# Patient Record
Sex: Male | Born: 1990 | Race: Black or African American | Hispanic: No | Marital: Single | Smoking: Never smoker
Health system: Southern US, Community
[De-identification: ages and names within clinical notes are randomized; demographics above are authoritative.]

## PROBLEM LIST (undated history)

## (undated) DIAGNOSIS — L409 Psoriasis, unspecified: Secondary | ICD-10-CM

## (undated) DIAGNOSIS — L309 Dermatitis, unspecified: Secondary | ICD-10-CM

## (undated) HISTORY — DX: Dermatitis, unspecified: L30.9

## (undated) HISTORY — DX: Psoriasis, unspecified: L40.9

---
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
## 2008-05-31 ENCOUNTER — Inpatient Hospital Stay (HOSPITAL_COMMUNITY): Admission: EM | Admit: 2008-05-31 | Discharge: 2008-06-01 | Payer: Self-pay | Admitting: Family Medicine

## 2008-05-31 ENCOUNTER — Encounter (INDEPENDENT_AMBULATORY_CARE_PROVIDER_SITE_OTHER): Payer: Self-pay | Admitting: Internal Medicine

## 2009-07-16 DIAGNOSIS — Y249XXA Unspecified firearm discharge, undetermined intent, initial encounter: Secondary | ICD-10-CM | POA: Insufficient documentation

## 2009-07-16 DIAGNOSIS — W3400XA Accidental discharge from unspecified firearms or gun, initial encounter: Secondary | ICD-10-CM

## 2009-07-16 HISTORY — DX: Accidental discharge from unspecified firearms or gun, initial encounter: W34.00XA

## 2009-11-08 ENCOUNTER — Emergency Department (HOSPITAL_COMMUNITY): Admission: EM | Admit: 2009-11-08 | Discharge: 2009-11-08 | Payer: Self-pay | Admitting: Emergency Medicine

## 2009-12-09 IMAGING — CT CT MAXILLOFACIAL W/ CM
3 of 4 series · 16 of 37 positions shown, 19 images · IV contrast (omniscan)
Comparison: 07/21/2004

CLINICAL DATA: Right-sided facial swelling, clinical concern for
abscess

CT MAXILLOFACIAL WITH CONTRAST
TECHNIQUE: Multidetector CT imaging of the maxillofacial
structures was performed with intravenous contrast. Multiplanar CT
image reconstructions were also generated.
Contrast: 100 ml Omniscan 300 IV contrast

[Series 4: recon 3: supine facial bones · axial · 0.37mm/px · z∈[+4,+195]mm · 12 of 173 slices shown, 15 images]
[im 10/173  brain]
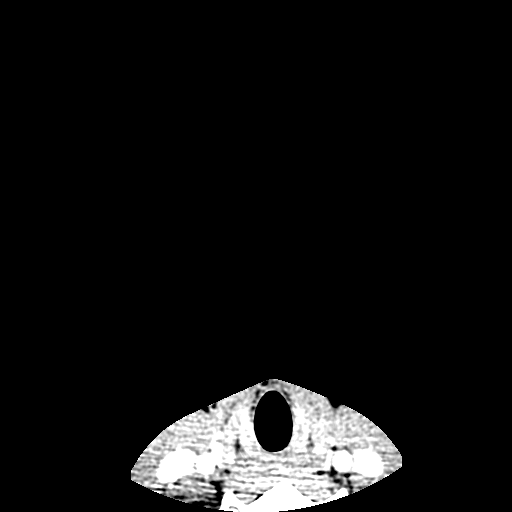
[im 10/173  bone]
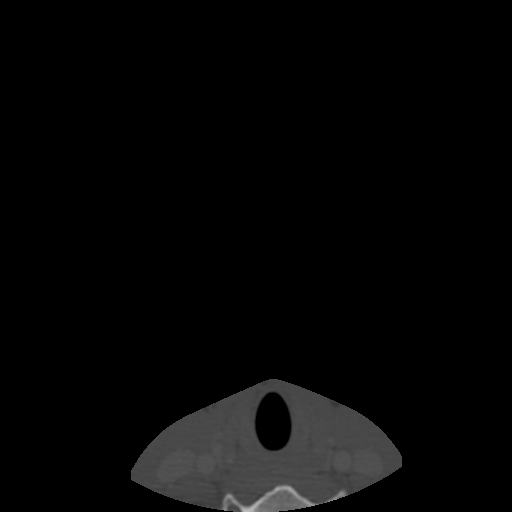
[im 28/173  bone]
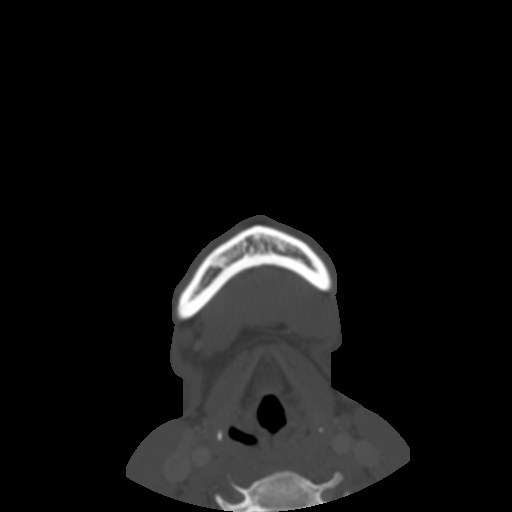
[im 37/173  bone]
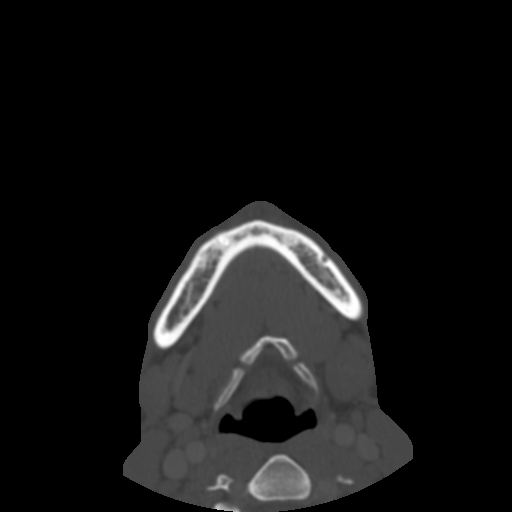
[im 55/173  bone]
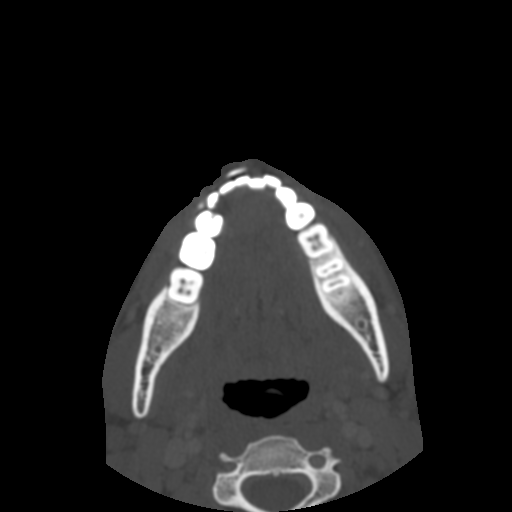
[im 64/173  brain]
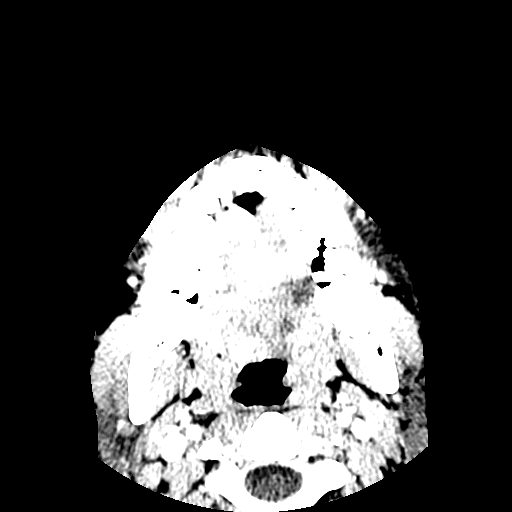
[im 64/173  bone]
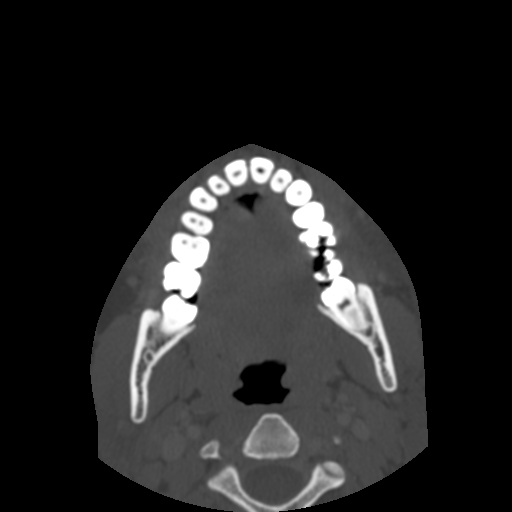
[im 82/173  bone]
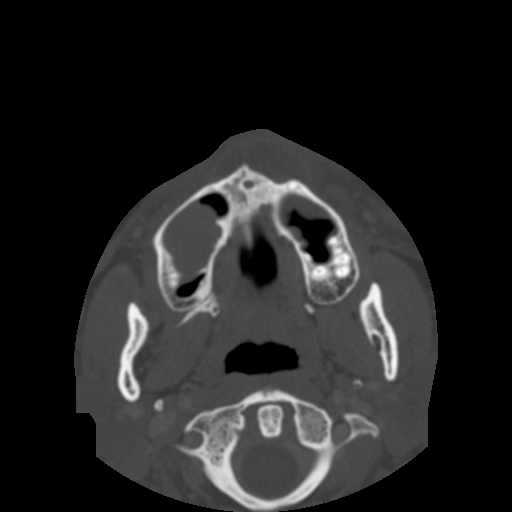
[im 91/173  bone]
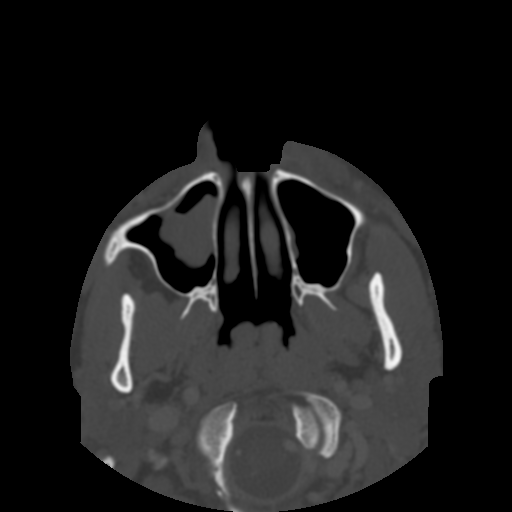
[im 109/173  bone]
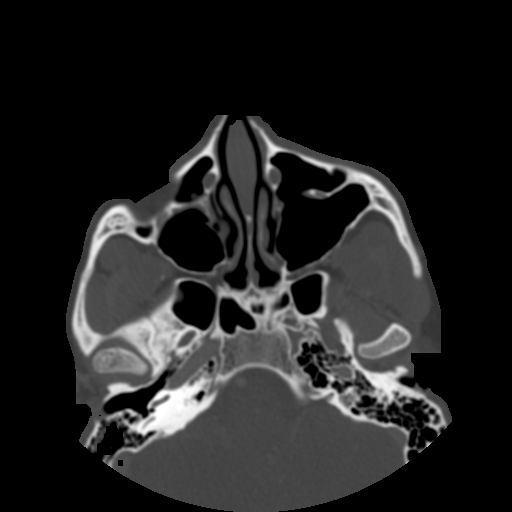
[im 118/173  brain]
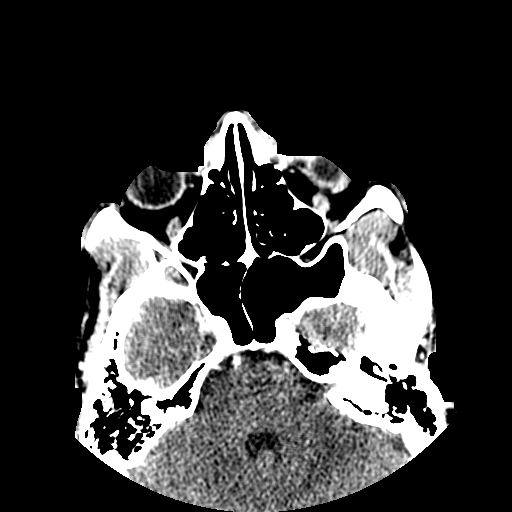
[im 118/173  bone]
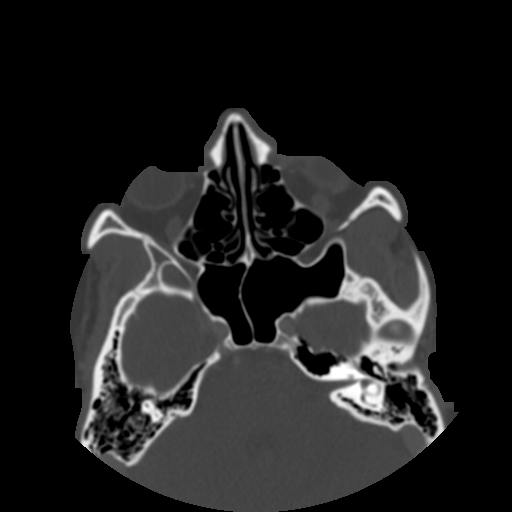
[im 136/173  bone]
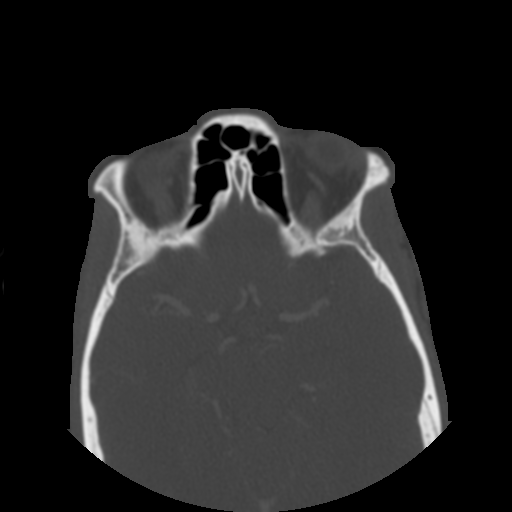
[im 145/173  bone]
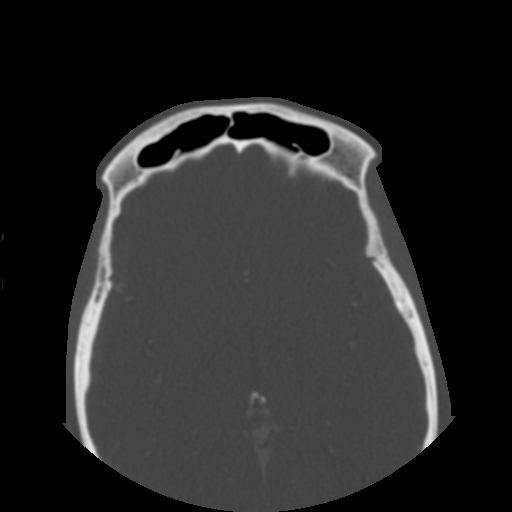
[im 163/173  bone]
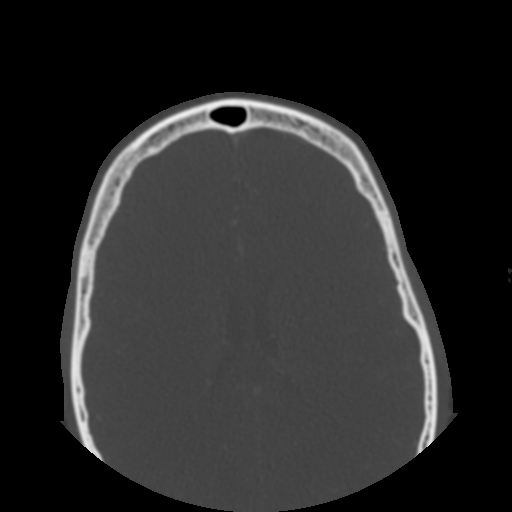

[Series 400: reformatted · coronal · 0.43mm/px · 3 of 85 slices shown (1 of 2)]
[im 14/85  bone]
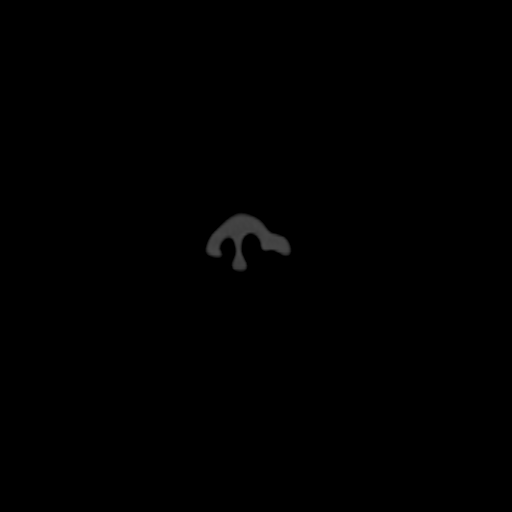
[im 27/85  bone]
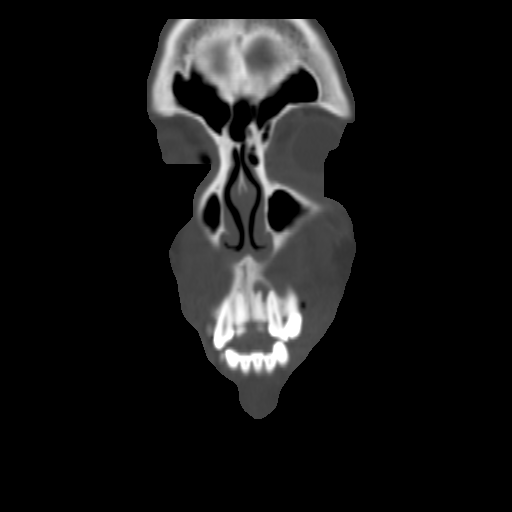
[im 39/85  bone]
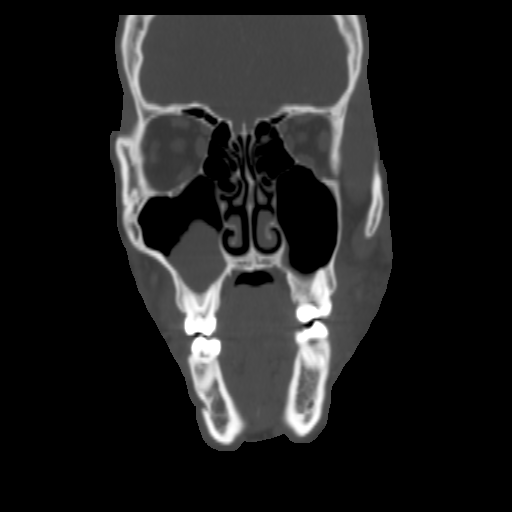

[Series 401: reformatted · sagittal · 0.43mm/px · 1 of 85 slices shown (2 of 2)]
[im 71/85  bone]
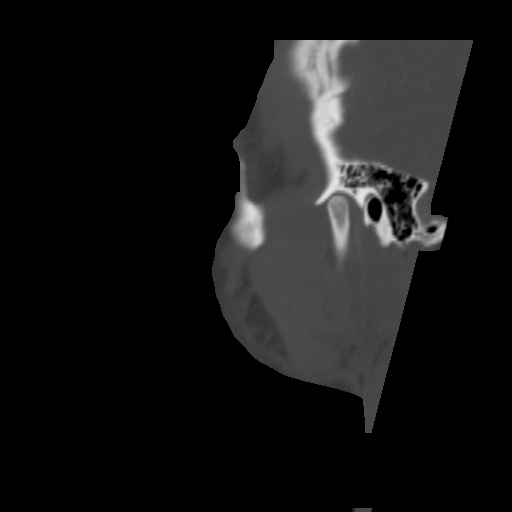

[16 of 37 positions shown; findings below may reference images not displayed]

FINDINGS: Right maxillary and sphenoid sinus mucous retention cysts
versus polyps are again noted.  Minimal left maxillary sinus
mucoperiosteal thickening is new since the prior study.  No
fracture identified.  The orbits are intact. Possible remote left
nasal bone fracture is stable.

There is lucency at the root of the ninth and tenth teeth (left
upper central and lateral incisors, respectively), with peri apical
lucency and peripherally enhancing fluid collection extending
superiorly, measuring overall 3.2 x 1.5 centimeters.  There is soft
tissue swelling along the left nasolabial fold region but sparing
of the left pre and post septal regions.
IMPRESSION: Peri apical abscess emanating from the left upper central and
lateral incisors extending to the left nasolabial fold but sparing
the pre and post septal compartments.  Findings discussed with Dr.
Franz Luis by Dr. Hight at [DATE] p.m. on 05/30/2008.

## 2010-11-28 NOTE — Consult Note (Signed)
NAME:  Patrick Silva, Patrick Silva NO.:  [id number]   MEDICAL RECORD NO.:  [id number]          PATIENT TYPE:  INP   LOCATION:  5121                         FACILITY:  [facility]   PHYSICIAN:  Kinnie Scales. Annalee Genta, M.D.DATE OF BIRTH:  24-Aug-1990   DATE OF CONSULTATION:  05/30/2008  DATE OF DISCHARGE:                                 CONSULTATION   BRIEF HISTORY:  The patient is a 20 year old black male who presents to  [facility] Emergency Department with a history of progressive  left paranasal and upper lip swelling.  The patient had a history of  previous root canal in the left central incisor.  He developed  increasing pain, swelling, and fullness in the alveolar and paranasal  area which had progressed over a 48-hour period.  The patient was unable  to tolerate oral diet because of significant pain and had a low-grade  fever.  He was evaluated by the emergency room staff and found to have  soft tissue swelling consistent with an abscess.  A CT scan was obtained  which showed periapical fluid and a bone erosion in the anterior maxilla  with significant soft tissue swelling overlying the area.  ENT facial  trauma dental consult was obtained for evaluation and management of the  patient's dental alveolar/odontogenic abscess.  Mr. Earna Coder,  administrator on call was notified of the patient's situation and  improved ENT evaluation.   PHYSICAL EXAMINATION:  GENERAL:  The patient is a healthy-appearing 20-  year-old black male, alert in no distress, moderately uncomfortable.  HEENT:  Facial examination shows significant swelling in the paranasal  region.  Intraoral exam shows good dentition with significant swelling  and fullness in the left upper gingivobuccal sulcus.   IMPRESSION:  1. Odontogenic abscess involving left central incisors.  2. Soft tissue/facial abscess.   ASSESSMENT AND PLAN:  Given the patient's examination findings including  CT scan, I recommended to  undertake incision and drainage of a dental  abscess.  This was performed at the bedside in the emergency department  without complication.  The patient is admitted to the hospital on the  [facility] for further antibiotic therapy and a dental  consultation will be obtained for definitive management and extraction  of the teeth.   PROCEDURE:  Incision and drainage of left facial abscess from  odontogenic infection.  The patient was injected with 0.5% mL of 1%  lidocaine with 1:100,000 solution of epinephrine in the left  gingivobuccal sulcus.  After allowing adequate time for vasoconstriction  anesthesia, a #15 scalpel was used to create a 1-cm incision in the  mucosa.  This was carried through the soft tissue and immediately a  large amount of purulent material was expressed from the wound.  A  quarter-inch Penrose drain was placed and sutured into position with 3-0  silk suture.  There was significant improvement in soft tissue swelling  and discomfort at the conclusion of the procedure which the patient  tolerated without complication or difficulty, minimal bleeding.           ______________________________  Kinnie Scales. Annalee Genta, M.D.     DLS/MEDQ  D:  16/04/9603  T:  05/31/2008  Job:  [id number]

## 2010-11-28 NOTE — H&P (Signed)
NAME:  JOHNATHON, OLDEN NO.:  [id number]   MEDICAL RECORD NO.:  [id number]          PATIENT TYPE:  INP   LOCATION:                               FACILITY:  [facility]   PHYSICIAN:  Lonia Blood, M.D.       DATE OF BIRTH:  1990/11/28   DATE OF ADMISSION:  05/31/2008  DATE OF DISCHARGE:                              HISTORY & PHYSICAL   PRIMARY CARE PHYSICIAN:  This patient does not have a primary care  physician.   CHIEF COMPLAINT:  Facial pain.   HISTORY OF PRESENT ILLNESS:  Mr. Studer is a 20 year old gentleman  without any significant past medical history who presents tonight to the  [facility] Emergency Room with complaints of left-sided facial swelling  and pain that apparently has been tracking closer to his left eye for  the past 24 hours.   PAST MEDICAL HISTORY:  Cardiac murmur.   SOCIAL HISTORY:  The patient endorses using drugs and smoking  cigarettes.  He is unemployed.   FAMILY HISTORY:  Noncontributory.   REVIEW OF SYSTEMS:  As per HPI, all other systems negative.   PHYSICAL EXAMINATION:  VITAL SIGNS:  Upon admission, temperature is  99.7, blood pressure 161/89, pulse 104, respirations 20, saturation 99%  on room air.  GENERAL APPEARANCE:  He is one of the well-built, well-developed  gentleman in no acute distress, sitting on the stretcher, alert and  oriented to place, person, and time.  His face indicates swelling over  the left maxillary area.  HEENT:  Extraocular movements intact.  Conjunctiva is pink and no signs  of scleral erythema.  Throat is clear.  NECK:  Supple.  No JVD.  CHEST:  Clear to auscultation without wheeze, rhonchi, or crackles.  HEART:  Regular rate and rhythm with a 3/6 systolic murmur.  ABDOMEN:  Soft and nontender.  Bowel sounds are present.  EXTREMITIES:  Lower extremity without edema.   ASSESSMENT AND PLAN:  This is a 20 year old gentleman with odontogenic  abscess extending very close to the left maxillary sinus.  The  patient  will be placed on intravenous Unasyn and he will have incision and  drainage at the bedside by Dr. Annalee Genta.  Full dental medicine consult  will be obtained tomorrow for a final surgical approach.  For the  patient's cardiac murmur, we will obtain a transthoracic echocardiogram  to evaluate further.      Lonia Blood, M.D.  Electronically Signed     SL/MEDQ  D:  05/31/2008  T:  05/31/2008  Job:  [id number]   cc:   Onalee Hua L. Annalee Genta, M.D.

## 2010-11-28 NOTE — Discharge Summary (Signed)
NAMEFORRESTER, Patrick Silva NO.:  [id number]   MEDICAL RECORD NO.:  [id number]          PATIENT TYPE:  INP   LOCATION:  5150                         FACILITY:  [facility]   PHYSICIAN:  Beckey Rutter, MD  DATE OF BIRTH:  Aug 23, 1990   DATE OF ADMISSION:  05/30/2008  DATE OF DISCHARGE:  06/01/2008                               DISCHARGE SUMMARY   PRIMARY PHYSICIAN:  Unassigned.   HISTORY OF PRESENT ILLNESS AND HOSPITAL COURSE:  A 20 year old gentleman  with no significant past medical history came with left-sided facial  swelling and pain, and the patient was found to have abscess on the  scan.  The patient now is status post I&D, done by Dr. Annalee Genta.  The  patient is very stable for discharge today, and he is not willing to  wait for official dental consultation.  Numbers for outside dentist was  provided to him and the discharge instructions.  The patient was given  prescription for Flagyl and Augmentin.  He is stable for discharge.   HOSPITAL IMAGING:  CT of maxillofacial on May 30, 2008.  Impression  is periapical abscess emanating from the left upper central and lateral  incisors.  Incisors extending into the left nasolabial fold but sparing  the pre and post septal compartments.   DISCHARGE DIAGNOSIS:  Odontogenic abscess status post incision and  drainage and intravenous antibiotics.   DISCHARGE MEDICATIONS:  1. Flagyl 500 mg p.o. t.i.d. for 5 days.  2. Augmentin 500 mg p.o. b.i.d. for 5 days.   DISCHARGE/PLAN:  The patient was given phone numbers for a dentist to  follow up with.  Again, he refused to stay in the hospital for in-house  hospital consultation.  He is very stable for discharge in any event.  The patient was given prescription and importance of followup with the  dentist and medical doctor was stressed.  The patient is aware and  agreeable to discharge plan.      Beckey Rutter, MD  Electronically Signed     EME/MEDQ  D:   06/01/2008  T:  06/01/2008  Job:  [id number]

## 2011-04-18 LAB — DIFFERENTIAL
Lymphs Abs: 1.7
Monocytes Absolute: 2 — ABNORMAL HIGH
Monocytes Relative: 10
Neutro Abs: 16 — ABNORMAL HIGH
Neutrophils Relative %: 81 — ABNORMAL HIGH

## 2011-04-18 LAB — CBC
HCT: 39.1
Hemoglobin: 13
Hemoglobin: 13.2
Hemoglobin: 14
MCHC: 33.8
MCV: 83.2
Platelets: 214
RBC: 4.7
RBC: 5.07
RDW: 13.3
WBC: 19.8 — ABNORMAL HIGH

## 2011-04-18 LAB — BASIC METABOLIC PANEL
BUN: 6
CO2: 29
Chloride: 98
GFR calc non Af Amer: >60
Glucose, Bld: 96
Potassium: 3.7
Sodium: 137

## 2011-07-17 DIAGNOSIS — L409 Psoriasis, unspecified: Secondary | ICD-10-CM | POA: Insufficient documentation

## 2011-07-17 HISTORY — DX: Psoriasis, unspecified: L40.9

## 2017-09-24 ENCOUNTER — Telehealth: Payer: Self-pay | Admitting: Internal Medicine

## 2017-09-24 ENCOUNTER — Encounter: Payer: Self-pay | Admitting: Internal Medicine

## 2017-09-24 ENCOUNTER — Ambulatory Visit: Payer: Self-pay | Admitting: Internal Medicine

## 2017-09-24 VITALS — BP 130/80 | HR 66 | Resp 12 | Ht 71.0 in | Wt 189.0 lb

## 2017-09-24 DIAGNOSIS — L409 Psoriasis, unspecified: Secondary | ICD-10-CM

## 2017-09-24 DIAGNOSIS — R590 Localized enlarged lymph nodes: Secondary | ICD-10-CM

## 2017-09-24 DIAGNOSIS — L290 Pruritus ani: Secondary | ICD-10-CM

## 2017-09-24 DIAGNOSIS — Z13220 Encounter for screening for lipoid disorders: Secondary | ICD-10-CM

## 2017-09-24 NOTE — Progress Notes (Signed)
Patient ID: Patrick DownerJohn Ostrom, male   DOB: 1990/12/06, 27 y.o.   MRN: [id number]  LCSW completed new patient screening with pt. Pt reported that he does not sleep well due to his infant son. He reported that he has appetite issues, as he does not feel like eating after his 2 hour workout in the morning. He shared that he does struggle with some anger issues, as he is very goal-driven and impatient, but that he feels this is getting better with time and maturity. No social determinants of health issues noted. LCSW provided contact info and gave information about social work services available at the clinic. No follow-up needed at this time.

## 2017-09-24 NOTE — Patient Instructions (Addendum)
Drink a glass of water before every meal Drink 6-8 glasses of water daily Eat three meals daily Eat a protein and healthy fat with every meal (eggs,fish, chicken, [place]key and limit red meats) Eat 5 servings of vegetables daily, mix the colors Eat 2 servings of fruit daily with skin, if skin is edible Use smaller plates Put food/utensils down as you chew and swallow each bite Eat at a table with friends/family at least once daily, no TV Do not eat in front of the TV  Recent studies show that people who consume all of their calories in a 12 hour period lose weight more efficiently.  For example, if you eat your first meal at 7:00 a.m., your last meal of the day should be completed by 7:00 p.m.  Call in name of cream for psoriasis  After BM:  Witch hazel, and hair dryer.   We will look into pinworm paddle

## 2017-09-24 NOTE — Telephone Encounter (Signed)
Patient called with requested Rx information,  Tacrolimus ointmment 0.1% 30 grams, ndc# [id number].
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
## 2017-09-24 NOTE — Telephone Encounter (Signed)
To Dr. Mulberry for further direction 

## 2017-09-25 ENCOUNTER — Encounter: Payer: Self-pay | Admitting: Internal Medicine

## 2017-09-25 LAB — CBC WITH DIFFERENTIAL/PLATELET
Basophils Absolute: 0 10*3/uL (ref 0.0–0.2)
Basos: 0 %
EOS (ABSOLUTE): 0.1 10*3/uL (ref 0.0–0.4)
Eos: 1 %
HEMOGLOBIN: 15.4 g/dL (ref 13.0–17.7)
Hematocrit: 46.7 % (ref 37.5–51.0)
IMMATURE GRANS (ABS): 0 10*3/uL (ref 0.0–0.1)
Immature Granulocytes: 0 %
LYMPHS: 41 %
Lymphocytes Absolute: 2.9 10*3/uL (ref 0.7–3.1)
MCH: 27.3 pg (ref 26.6–33.0)
MCHC: 33 g/dL (ref 31.5–35.7)
MCV: 83 fL (ref 79–97)
MONOCYTES: 12 %
Monocytes Absolute: 0.8 10*3/uL (ref 0.1–0.9)
Neutrophils Absolute: 3.1 10*3/uL (ref 1.4–7.0)
Neutrophils: 46 %
PLATELETS: 366 10*3/uL (ref 150–379)
RBC: 5.65 x10E6/uL (ref 4.14–5.80)
RDW: 14.6 % (ref 12.3–15.4)
WBC: 6.9 10*3/uL (ref 3.4–10.8)

## 2017-09-25 LAB — COMPREHENSIVE METABOLIC PANEL
ALBUMIN: 4.9 g/dL (ref 3.5–5.5)
ALK PHOS: 44 IU/L (ref 39–117)
ALT: 27 IU/L (ref 0–44)
AST: 39 IU/L (ref 0–40)
Albumin/Globulin Ratio: 1.6 (ref 1.2–2.2)
BUN / CREAT RATIO: 14 (ref 9–20)
BUN: 17 mg/dL (ref 6–20)
Bilirubin Total: 0.5 mg/dL (ref 0.0–1.2)
CHLORIDE: 99 mmol/L (ref 96–106)
CO2: 21 mmol/L (ref 20–29)
CREATININE: 1.25 mg/dL (ref 0.76–1.27)
Calcium: 10.3 mg/dL — ABNORMAL HIGH (ref 8.7–10.2)
GFR calc Af Amer: 90 mL/min/{1.73_m2} (ref >59)
GFR calc non Af Amer: 78 mL/min/{1.73_m2} (ref >59)
GLUCOSE: 79 mg/dL (ref 65–99)
Globulin, Total: 3.1 g/dL (ref 1.5–4.5)
Potassium: 5.1 mmol/L (ref 3.5–5.2)
Sodium: 141 mmol/L (ref 134–144)
TOTAL PROTEIN: 8 g/dL (ref 6.0–8.5)

## 2017-09-25 LAB — LIPID PANEL W/O CHOL/HDL RATIO
CHOLESTEROL TOTAL: 171 mg/dL (ref 100–199)
HDL: 36 mg/dL — AB (ref >39)
LDL CALC: 125 mg/dL — AB (ref 0–99)
Triglycerides: 49 mg/dL (ref 0–149)
VLDL CHOLESTEROL CAL: 10 mg/dL (ref 5–40)

## 2017-09-26 ENCOUNTER — Telehealth: Payer: Self-pay | Admitting: Internal Medicine

## 2017-09-26 NOTE — Telephone Encounter (Signed)
Unable to find medication assistance program for Tacrolimus on line.   Call into [facility]:  Apparently patient last seen there in 2016 with Dr Doreen BeamWhitworth Left message asking about medication assistance and his records.  Release sent Tuesday.

## 2017-11-20 ENCOUNTER — Telehealth: Payer: Self-pay | Admitting: Internal Medicine

## 2017-11-20 MED ORDER — TRIAMCINOLONE ACETONIDE 0.1 % EX CREA: TOPICAL_CREAM | CUTANEOUS | 1 refills | Status: DC

## 2017-11-20 MED ORDER — TACROLIMUS 0.03 % EX OINT: TOPICAL_OINTMENT | CUTANEOUS | 1 refills | Status: DC

## 2017-11-20 NOTE — Telephone Encounter (Signed)
[facility] can fill patient's Tacrolimus for 30 g for $40 with free delivery. Fax number [phone] Called patient subsequently:  He can cover the cost for Tacrolimus. He states his daughter was in fact not having anal itching. He has been using witch hazel with some improvement. Still with itching at night and scratching in his sleep Discussed using scant amount of Triamcinolone cream only to area of itching at bedtime as needed. Will send into [facility] Phone call turned over to [place] to make appt in 2 months.

## 2017-12-05 ENCOUNTER — Encounter: Payer: Self-pay | Admitting: Internal Medicine

## 2017-12-05 ENCOUNTER — Ambulatory Visit: Payer: Self-pay | Admitting: Internal Medicine

## 2017-12-05 VITALS — BP 130/82 | HR 80 | Resp 12 | Ht 71.0 in | Wt 190.0 lb

## 2017-12-05 DIAGNOSIS — J029 Acute pharyngitis, unspecified: Secondary | ICD-10-CM

## 2017-12-05 DIAGNOSIS — B309 Viral conjunctivitis, unspecified: Secondary | ICD-10-CM

## 2017-12-05 NOTE — Patient Instructions (Signed)
Ibuprofen 200 mg 2-4 tabs every 6 hours with food as needed for throat pain Dayquil and Nyquil for other symptoms Call report tomorrow.

## 2017-12-05 NOTE — Progress Notes (Signed)
   Subjective:    Patient ID: Patrick Silva, male    DOB: 1990-09-12, 27 y.o.   MRN: [id number]  HPI   1.  Eyes turned red 4 days ago.  Young son with red eye last week.  Having drainage and foreign body sensation.  Throat started hurting 3 days ago.  Last night felt cold and "chilling"  Did not check his temp. This morning he awakened with his left ear hurting.   Morning crust in eyes.  States better than earlier in the week. Has been taking Thera Flu, Dayquil and other OTC cold remedies with some improvement. No dyspnea or cough. No headache, nausea or vomiting.  Took Claritin yesterday.  No outpatient medications have been marked as taking for the 12/05/17 encounter (Office Visit) with Julieanne Manson, MD.    No Known Allergies  Review of Systems     Objective:   Physical Exam  NAD HEENT:  PERRL, EOMI,  Conjunctivae injected without discharge. TMs pearly gray.  Tonsillar area red with yellow green exudate.  Neck:  Supple,  Anterior cervical adenopthy Chest:  CTA CV:  RRR without murmur or rub.  Abd:  S, NT, No HSM or mass, + BS Skin:  No rash      Assessment & Plan:  Viral conjunctivitis and pharyngitis:  Supportive care with ibuprofena and Dayquil/Nyquil. Call report tomorrow.

## 2017-12-17 ENCOUNTER — Ambulatory Visit: Payer: Self-pay | Admitting: Internal Medicine

## 2019-03-24 ENCOUNTER — Encounter: Payer: Self-pay | Admitting: Internal Medicine

## 2019-03-24 ENCOUNTER — Other Ambulatory Visit: Payer: Self-pay

## 2019-03-24 ENCOUNTER — Ambulatory Visit (INDEPENDENT_AMBULATORY_CARE_PROVIDER_SITE_OTHER): Payer: Self-pay | Admitting: Internal Medicine

## 2019-03-24 VITALS — BP 128/80 | HR 82 | Resp 12 | Ht 71.0 in | Wt 187.0 lb

## 2019-03-24 DIAGNOSIS — E785 Hyperlipidemia, unspecified: Secondary | ICD-10-CM

## 2019-03-24 DIAGNOSIS — L409 Psoriasis, unspecified: Secondary | ICD-10-CM

## 2019-03-24 MED ORDER — TACROLIMUS 0.03 % EX OINT: TOPICAL_OINTMENT | CUTANEOUS | 1 refills | Status: DC

## 2019-03-24 MED ORDER — TRIAMCINOLONE ACETONIDE 0.1 % EX CREA: TOPICAL_CREAM | CUTANEOUS | 1 refills | Status: DC

## 2019-03-24 NOTE — Progress Notes (Signed)
    Subjective:    Patient ID: Patrick Silva, male   DOB: 12/11/1990, 28 y.o.   MRN: [id number]   HPI   1.  Psoriasis of penis:  Needs tacrolimus and triamcinolone cream for penis.  Has controlled his psoriasis well.  He uses the tacrolimus for 6 weeks, then breaks for 1-2 months until he starts having flaking again.  Has been using the Triamcinolone cream just here and there as needed to control flaking in between the tacrolimus.  No outpatient medications have been marked as taking for the 03/24/19 encounter (Office Visit) with Mack Hook, MD.   No Known Allergies   Review of Systems    Objective:   BP 128/80 (BP Location: Left Arm, Cuff Size: Normal)   Pulse 82   Resp 12   Ht 5\' 11"  (1.803 m)   Wt 187 lb (84.8 kg)   BMI 26.08 kg/m   Physical Exam  Small area of mild erythema on dorsal glans.  Minimal flaking.  No other genital lesions.   Assessment & Plan   Psoriasis of glans penis:  Tacrolimus and Triamcinolone cream refilled.  Dyslipidemia:  Checked last spring.  Discussed need to work on good fats in diet and get moving physically on a daily basis to improve.  Recheck in another year.

## 2019-06-07 DIAGNOSIS — E785 Hyperlipidemia, unspecified: Secondary | ICD-10-CM | POA: Insufficient documentation

## 2019-12-10 ENCOUNTER — Other Ambulatory Visit: Payer: Self-pay

## 2019-12-10 ENCOUNTER — Encounter: Payer: Self-pay | Admitting: Internal Medicine

## 2019-12-10 ENCOUNTER — Ambulatory Visit: Payer: Self-pay | Admitting: Internal Medicine

## 2019-12-10 VITALS — BP 122/70 | HR 80 | Resp 12 | Ht 71.0 in | Wt 183.0 lb

## 2019-12-10 DIAGNOSIS — L301 Dyshidrosis [pompholyx]: Secondary | ICD-10-CM

## 2019-12-10 DIAGNOSIS — L409 Psoriasis, unspecified: Secondary | ICD-10-CM

## 2019-12-10 DIAGNOSIS — L723 Sebaceous cyst: Secondary | ICD-10-CM

## 2019-12-10 MED ORDER — TACROLIMUS 0.03 % EX OINT: TOPICAL_OINTMENT | CUTANEOUS | 1 refills | Status: DC

## 2019-12-10 MED ORDER — TRIAMCINOLONE ACETONIDE 0.1 % EX CREA: TOPICAL_CREAM | CUTANEOUS | 2 refills | Status: DC
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
## 2019-12-10 NOTE — Patient Instructions (Signed)
Eucerin Cream for eczema relief

## 2020-03-24 ENCOUNTER — Other Ambulatory Visit: Payer: Self-pay

## 2020-03-28 ENCOUNTER — Ambulatory Visit: Payer: Self-pay | Admitting: Internal Medicine

## 2020-11-07 ENCOUNTER — Emergency Department (HOSPITAL_BASED_OUTPATIENT_CLINIC_OR_DEPARTMENT_OTHER)
Admission: EM | Admit: 2020-11-07 | Discharge: 2020-11-07 | Disposition: A | Discharge status: Home / Self Care | Payer: Medicaid Other | Attending: Emergency Medicine | Admitting: Emergency Medicine

## 2020-11-07 ENCOUNTER — Encounter (HOSPITAL_BASED_OUTPATIENT_CLINIC_OR_DEPARTMENT_OTHER): Payer: Self-pay | Admitting: Emergency Medicine

## 2020-11-07 ENCOUNTER — Telehealth: Payer: Self-pay | Admitting: Internal Medicine

## 2020-11-07 ENCOUNTER — Other Ambulatory Visit: Payer: Self-pay

## 2020-11-07 ENCOUNTER — Emergency Department (HOSPITAL_BASED_OUTPATIENT_CLINIC_OR_DEPARTMENT_OTHER): Payer: Medicaid Other

## 2020-11-07 DIAGNOSIS — R1084 Generalized abdominal pain: Secondary | ICD-10-CM | POA: Diagnosis not present

## 2020-11-07 DIAGNOSIS — F1210 Cannabis abuse, uncomplicated: Secondary | ICD-10-CM | POA: Insufficient documentation

## 2020-11-07 DIAGNOSIS — R112 Nausea with vomiting, unspecified: Secondary | ICD-10-CM

## 2020-11-07 LAB — CBC
HCT: 46.3 % (ref 39.0–52.0)
Hemoglobin: 15.3 g/dL (ref 13.0–17.0)
MCH: 27 pg (ref 26.0–34.0)
MCHC: 33 g/dL (ref 30.0–36.0)
MCV: 81.7 fL (ref 80.0–100.0)
Platelets: 294 10*3/uL (ref 150–400)
RBC: 5.67 MIL/uL (ref 4.22–5.81)
RDW: 13.6 % (ref 11.5–15.5)
WBC: 16.8 10*3/uL — ABNORMAL HIGH (ref 4.0–10.5)
nRBC: 0 % (ref 0.0–0.2)

## 2020-11-07 LAB — URINALYSIS, ROUTINE W REFLEX MICROSCOPIC
Bilirubin Urine: NEGATIVE
Glucose, UA: NEGATIVE mg/dL
Hgb urine dipstick: NEGATIVE
Leukocytes,Ua: NEGATIVE
Nitrite: NEGATIVE
Protein, ur: 30 mg/dL — AB
Specific Gravity, Urine: 1.038 — ABNORMAL HIGH (ref 1.005–1.030)
pH: 6 (ref 5.0–8.0)

## 2020-11-07 LAB — COMPREHENSIVE METABOLIC PANEL
ALT: 35 U/L (ref 0–44)
AST: 24 U/L (ref 15–41)
Albumin: 5.5 g/dL — ABNORMAL HIGH (ref 3.5–5.0)
Alkaline Phosphatase: 53 U/L (ref 38–126)
Anion gap: 6 (ref 5–15)
BUN: 18 mg/dL (ref 6–20)
CO2: 31 mmol/L (ref 22–32)
Calcium: 10.4 mg/dL — ABNORMAL HIGH (ref 8.9–10.3)
Chloride: 101 mmol/L (ref 98–111)
Creatinine, Ser: 0.95 mg/dL (ref 0.61–1.24)
GFR, Estimated: >60 mL/min (ref >60)
Glucose, Bld: 113 mg/dL — ABNORMAL HIGH (ref 70–99)
Potassium: 4 mmol/L (ref 3.5–5.1)
Sodium: 138 mmol/L (ref 135–145)
Total Bilirubin: 1.4 mg/dL — ABNORMAL HIGH (ref 0.3–1.2)
Total Protein: 8.5 g/dL — ABNORMAL HIGH (ref 6.5–8.1)

## 2020-11-07 LAB — RAPID URINE DRUG SCREEN, HOSP PERFORMED
Amphetamines: NOT DETECTED
Barbiturates: NOT DETECTED
Benzodiazepines: NOT DETECTED
Cocaine: NOT DETECTED
Opiates: NOT DETECTED
Tetrahydrocannabinol: POSITIVE — AB

## 2020-11-07 LAB — LIPASE, BLOOD: Lipase: 11 U/L (ref 11–51)

## 2020-11-07 MED ORDER — ONDANSETRON 4 MG PO TBDP
4.0000 mg | ORAL_TABLET | Freq: Once | ORAL | Status: AC
  Administered 2020-11-07: 4 mg via ORAL
  Filled 2020-11-07: qty 1

## 2020-11-07 MED ORDER — LACTATED RINGERS IV BOLUS
1000.0000 mL | Freq: Once | INTRAVENOUS | Status: AC
  Administered 2020-11-07: 1000 mL via INTRAVENOUS

## 2020-11-07 MED ORDER — ONDANSETRON 4 MG PO TBDP: 4.0000 mg | ORAL_TABLET | Freq: Three times a day (TID) | ORAL | 0 refills | Status: DC | PRN

## 2020-11-07 MED ORDER — IOHEXOL 300 MG/ML  SOLN
100.0000 mL | Freq: Once | INTRAMUSCULAR | Status: AC | PRN
  Administered 2020-11-07: 100 mL via INTRAVENOUS

## 2020-11-07 MED ORDER — DROPERIDOL 2.5 MG/ML IJ SOLN
1.2500 mg | Freq: Once | INTRAMUSCULAR | Status: DC
  Filled 2020-11-07: qty 2

## 2020-11-07 MED ORDER — FENTANYL CITRATE (PF) 100 MCG/2ML IJ SOLN
50.0000 ug | Freq: Once | INTRAMUSCULAR | Status: AC
  Administered 2020-11-07: 50 ug via INTRAVENOUS
  Filled 2020-11-07: qty 2

## 2020-11-07 NOTE — Telephone Encounter (Signed)
Patient's mother called requesting an appointment for the patient. She stated that she called the paramedics to her house because patient is in severe pain, shivering, stomachache, vomit and diarrhea.  Paramedics checked his virals and all were fine, he was told that he might be having a virus or food poison. Please advise.

## 2020-11-07 NOTE — ED Triage Notes (Signed)
Pt via ems from home with n/v/d/abdominal pain today. Pt believes it is food poisoning. VSS: 140/70, HR64, RR 18, 100% RA, 97degree temp

## 2020-11-07 NOTE — ED Provider Notes (Signed)
[facility] EMERGENCY DEPT Provider Note   CSN: [id number] Arrival date & time: 11/07/20  1631     History Chief Complaint  Patient presents with  . Emesis  . Diarrhea    Patrick Silva is a 30 y.o. male.  Presented to the emergency room with concern for nausea vomiting abdominal pain.  Patient reports yesterday he did smokes marijuana.  Last night ate at [employer].  Pain started this morning, has had multiple episodes of vomiting, nonbloody nonbilious.  Still feels nauseated, pain is throughout his entire abdomen.  Denies fevers.  Denies any medical problems.  Denies prior episodes like this.  HPI     Past Medical History:  Diagnosis Date  . Psoriasis 2013    Patient Active Problem List   Diagnosis Date Noted  . 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 Sebaceous cyst 12/10/2019  . Dyslipidemia 06/07/2019  . Psoriasis 07/17/2011    History reviewed. No pertinent surgical history.     Family History  Problem Relation Age of Onset  . Cancer Mother        throat  . Hypertension Mother     Social History   Tobacco Use  . Smoking status: Never Smoker  . Smokeless tobacco: Never Used  Vaping Use  . Vaping Use: Never used  Substance Use Topics  . Alcohol use: Yes    Comment: rare shot  . Drug use: Yes    Frequency: 7.0 times per week    Types: Marijuana    Comment: daily use--smokes oil via vape    Home Medications Prior to Admission medications   Medication Sig Start Date End Date Taking? Authorizing Provider  ondansetron (ZOFRAN ODT) 4 MG disintegrating tablet Take 1 tablet (4 mg total) by mouth every 8 (eight) hours as needed for nausea or vomiting. 11/07/20  Yes Milagros Loll, MD  tacrolimus (PROTOPIC) 0.03 % ointment Apply to affected area twice daily for up to 6 weeks, then off 12/10/19   Julieanne Manson, MD  triamcinolone cream (KENALOG) 0.1 % Apply scant amount topically to affected area twice daily as needed. 12/10/19   Julieanne Manson, MD    Allergies    Patient has no known allergies.  Review of Systems   Review of Systems  Constitutional: Negative for chills and fever.  HENT: Negative for ear pain and sore throat.   Eyes: Negative for pain and visual disturbance.  Respiratory: Negative for cough and shortness of breath.   Cardiovascular: Negative for chest pain and palpitations.  Gastrointestinal: Positive for abdominal pain, nausea and vomiting.  Genitourinary: Negative for dysuria and hematuria.  Musculoskeletal: Negative for arthralgias and back pain.  Skin: Negative for color change and rash.  Neurological: Negative for seizures and syncope.  All other systems reviewed and are negative.   Physical Exam Updated Vital Signs BP 138/79 (BP Location: Right Arm)   Pulse 64   Temp 98.3 F (36.8 C) (Oral)   Resp 20   Ht 6' (1.829 m)   Wt 85.5 kg   SpO2 100%   BMI 25.55 kg/m   Physical Exam Vitals and nursing note reviewed.  Constitutional:      Appearance: He is well-developed.  HENT:     Head: Normocephalic and atraumatic.  Eyes:     Conjunctiva/sclera: Conjunctivae normal.  Cardiovascular:     Rate and Rhythm: Normal rate and regular rhythm.     Heart sounds: No murmur heard.   Pulmonary:     Effort: Pulmonary effort is  normal. No respiratory distress.     Breath sounds: Normal breath sounds.  Abdominal:     Palpations: Abdomen is soft.     Tenderness: There is no abdominal tenderness.     Comments: Some generalized discomfort but no rebound or guarding  Musculoskeletal:     Cervical back: Neck supple.  Skin:    General: Skin is warm and dry.  Neurological:     General: No focal deficit present.     Mental Status: He is alert.  Psychiatric:        Mood and Affect: Mood normal.     ED Results / Procedures / Treatments   Labs (all labs ordered are listed, but only abnormal results are displayed) Labs Reviewed  COMPREHENSIVE METABOLIC PANEL - Abnormal; Notable for the  following components:      Result Value   Glucose, Bld 113 (*)    Calcium 10.4 (*)    Total Protein 8.5 (*)    Albumin 5.5 (*)    Total Bilirubin 1.4 (*)    All other components within normal limits  CBC - Abnormal; Notable for the following components:   WBC 16.8 (*)    All other components within normal limits  URINALYSIS, ROUTINE W REFLEX MICROSCOPIC - Abnormal; Notable for the following components:   Specific Gravity, Urine 1.038 (*)    Ketones, ur TRACE (*)    Protein, ur 30 (*)    All other components within normal limits  RAPID URINE DRUG SCREEN, HOSP PERFORMED - Abnormal; Notable for the following components:   Tetrahydrocannabinol POSITIVE (*)    All other components within normal limits  LIPASE, BLOOD    EKG None  Radiology CT ABDOMEN PELVIS W CONTRAST  Result Date: 11/07/2020 CLINICAL DATA:  30 year old male with right lower quadrant abdominal pain. EXAM: CT ABDOMEN AND PELVIS WITH CONTRAST TECHNIQUE: Multidetector CT imaging of the abdomen and pelvis was performed using the standard protocol following bolus administration of intravenous contrast. CONTRAST:  OMNIPAQUE IOHEXOL 300 MG/ML  SOLN COMPARISON:  None. FINDINGS: Lower chest: The visualized lung bases are clear. No intra-abdominal free air or free fluid. Hepatobiliary: No focal liver abnormality is seen. No gallstones, gallbladder wall thickening, or biliary dilatation. Pancreas: Unremarkable. No pancreatic ductal dilatation or surrounding inflammatory changes. Spleen: Normal in size without focal abnormality. Adrenals/Urinary Tract: Adrenal glands are unremarkable. Kidneys are normal, without renal calculi, focal lesion, or hydronephrosis. Bladder is unremarkable. Stomach/Bowel: There is no bowel obstruction or active inflammation. The appendix is normal. Vascular/Lymphatic: The abdominal aorta and IVC unremarkable. No portal venous gas. Several top-normal right lower quadrant lymph nodes, likely reactive.  Reproductive: The prostate and seminal vesicles are grossly unremarkable. No pelvic masses Other: None Musculoskeletal: No acute or significant osseous findings. IMPRESSION: No bowel obstruction or active inflammation.  Normal appendix. Electronically Signed   By: Elgie Collard M.D.   On: 11/07/2020 19:48    Procedures Procedures   Medications Ordered in ED Medications  ondansetron (ZOFRAN-ODT) disintegrating tablet 4 mg (4 mg Oral Given 11/07/20 1916)  fentaNYL (SUBLIMAZE) injection 50 mcg (50 mcg Intravenous Given 11/07/20 1916)  lactated ringers bolus 1,000 mL (0 mLs Intravenous Stopped 11/07/20 2015)  iohexol (OMNIPAQUE) 300 MG/ML solution 100 mL (100 mLs Intravenous Contrast Given 11/07/20 1925)    ED Course  I have reviewed the triage vital signs and the nursing notes.  Pertinent labs & imaging results that were available during my care of the patient were reviewed by me and considered in  my medical decision making (see chart for details).  Clinical Course as of 11/08/20 1539  Mon Nov 07, 2020  2009 Symptoms completely resolved [RD]    Clinical Course User Index [RD] Milagros Loll, MD   MDM Rules/Calculators/A&P                         30 year old male presents to ER with concern for generalized abdominal pain nausea and vomiting.  On exam he appears well, did have some mild tenderness throughout his abdomen but no rebound or guarding.  Vitals were stable.  All of his basic labs are grossly normal.  Does endorse marijuana abuse.  CT negative for acute abdominal pathology.  Suspect viral gastritis versus cannabis hyperemesis.  Symptoms completely resolved after pain medicine and antiemetics given.  Believe he is appropriate for outpatient management at present.  I recommend he follow-up with his primary doctor for recheck later this week.  After the discussed management above, the patient was determined to be safe for discharge.  The patient was in agreement with this plan and  all questions regarding their care were answered.  ED return precautions were discussed and the patient will return to the ED with any significant worsening of condition.    Final Clinical Impression(s) / ED Diagnoses Final diagnoses:  Nausea and vomiting, intractability of vomiting not specified, unspecified vomiting type  Marijuana abuse    Rx / DC Orders ED Discharge Orders         Ordered    ondansetron (ZOFRAN ODT) 4 MG disintegrating tablet  Every 8 hours PRN        11/07/20 2010           Milagros Loll, MD 11/08/20 1539

## 2020-11-07 NOTE — Discharge Instructions (Addendum)
Recommend stopping marijuana.  Take Zofran as needed for nausea.  If your pain is worsening, your vomiting is uncontrolled, you should come back to ER for reassessment.  Would recommend follow-up with your primary doctor.

## 2020-11-07 NOTE — ED Triage Notes (Signed)
Pt arrives via EMS c/o generalized abd pain with N/V/D today.

## 2020-11-07 NOTE — Telephone Encounter (Signed)
Please call and ask for what symptoms he is currently having, if he is staying hydrated, what he is drinking and if eating, what that is.  How he is in comparison to when she called the paramedics

## 2020-11-08 NOTE — Telephone Encounter (Signed)
Patient's mother stated that she called the emergency services again right after she called [place] and patient was taken to the ED. Patient is feeling better but still weak, and is taking the medicine that was prescribed to him by the ED doctor. Patient will call later to schedule an appointment to follow up with Dr. Delrae Alfred.

## 2021-05-31 ENCOUNTER — Encounter (HOSPITAL_COMMUNITY): Payer: Self-pay

## 2021-05-31 ENCOUNTER — Emergency Department (HOSPITAL_COMMUNITY)
Admission: EM | Admit: 2021-05-31 | Discharge: 2021-05-31 | Disposition: A | Discharge status: Home / Self Care | Payer: No Typology Code available for payment source | Attending: Emergency Medicine | Admitting: Emergency Medicine

## 2021-05-31 DIAGNOSIS — Y9389 Activity, other specified: Secondary | ICD-10-CM | POA: Insufficient documentation

## 2021-05-31 DIAGNOSIS — S3992XA Unspecified injury of lower back, initial encounter: Secondary | ICD-10-CM | POA: Insufficient documentation

## 2021-05-31 DIAGNOSIS — S60811A Abrasion of right wrist, initial encounter: Secondary | ICD-10-CM | POA: Diagnosis not present

## 2021-05-31 DIAGNOSIS — Y92410 Unspecified street and highway as the place of occurrence of the external cause: Secondary | ICD-10-CM | POA: Diagnosis not present

## 2021-05-31 DIAGNOSIS — S6991XA Unspecified injury of right wrist, hand and finger(s), initial encounter: Secondary | ICD-10-CM | POA: Diagnosis present

## 2021-05-31 MED ORDER — KETOROLAC TROMETHAMINE 15 MG/ML IJ SOLN
15.0000 mg | Freq: Once | INTRAMUSCULAR | Status: AC
  Administered 2021-05-31: 15 mg via INTRAMUSCULAR
  Filled 2021-05-31: qty 1

## 2021-05-31 MED ORDER — CYCLOBENZAPRINE HCL 10 MG PO TABS: 10.0000 mg | ORAL_TABLET | Freq: Two times a day (BID) | ORAL | 0 refills | Status: DC | PRN
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
## 2021-05-31 NOTE — Discharge Instructions (Signed)
Return for any problem.   Use Ice/Heat as instructed.  Take Ibuprofen (Motrin) - 600 mg by mouth - every 8 hours for anti-inflammatory effect.  Take Acetaminophen (Tylenol) - 1000mg  by mouth - every 8 hours for pain.  Use prescribed muscle relaxant as needed.

## 2021-08-29 ENCOUNTER — Encounter (HOSPITAL_BASED_OUTPATIENT_CLINIC_OR_DEPARTMENT_OTHER): Payer: Self-pay | Admitting: Emergency Medicine

## 2021-08-29 ENCOUNTER — Other Ambulatory Visit: Payer: Self-pay

## 2021-08-29 ENCOUNTER — Emergency Department (HOSPITAL_BASED_OUTPATIENT_CLINIC_OR_DEPARTMENT_OTHER)
Admission: EM | Admit: 2021-08-29 | Discharge: 2021-08-29 | Disposition: A | Discharge status: Home / Self Care | Payer: 59 | Attending: Emergency Medicine | Admitting: Emergency Medicine

## 2021-08-29 DIAGNOSIS — T402X5A Adverse effect of other opioids, initial encounter: Secondary | ICD-10-CM | POA: Diagnosis not present

## 2021-08-29 DIAGNOSIS — G43909 Migraine, unspecified, not intractable, without status migrainosus: Secondary | ICD-10-CM | POA: Diagnosis not present

## 2021-08-29 DIAGNOSIS — T50905A Adverse effect of unspecified drugs, medicaments and biological substances, initial encounter: Secondary | ICD-10-CM

## 2021-08-29 DIAGNOSIS — R519 Headache, unspecified: Secondary | ICD-10-CM | POA: Diagnosis present

## 2021-08-29 MED ORDER — LORAZEPAM 2 MG/ML IJ SOLN
1.0000 mg | Freq: Once | INTRAMUSCULAR | Status: AC
  Administered 2021-08-29: 1 mg via INTRAVENOUS
  Filled 2021-08-29: qty 1

## 2021-08-29 MED ORDER — KETOROLAC TROMETHAMINE 15 MG/ML IJ SOLN
15.0000 mg | Freq: Once | INTRAMUSCULAR | Status: AC
  Administered 2021-08-29: 15 mg via INTRAVENOUS
  Filled 2021-08-29: qty 1

## 2021-08-29 MED ORDER — ACETAMINOPHEN 500 MG PO TABS
1000.0000 mg | ORAL_TABLET | Freq: Once | ORAL | Status: AC
  Administered 2021-08-29: 1000 mg via ORAL
  Filled 2021-08-29: qty 2

## 2021-08-29 MED ORDER — DIPHENHYDRAMINE HCL 50 MG/ML IJ SOLN
25.0000 mg | Freq: Once | INTRAMUSCULAR | Status: AC
  Administered 2021-08-29: 25 mg via INTRAVENOUS
  Filled 2021-08-29: qty 1

## 2021-08-29 MED ORDER — SODIUM CHLORIDE 0.9 % IV SOLN: INTRAVENOUS | Status: DC

## 2021-08-29 MED ORDER — METOCLOPRAMIDE HCL 5 MG/ML IJ SOLN
10.0000 mg | Freq: Once | INTRAMUSCULAR | Status: AC
  Administered 2021-08-29: 10 mg via INTRAVENOUS
  Filled 2021-08-29: qty 2

## 2021-08-29 MED ORDER — SODIUM CHLORIDE 0.9 % IV BOLUS
1000.0000 mL | Freq: Once | INTRAVENOUS | Status: AC
  Administered 2021-08-29: 1000 mL via INTRAVENOUS

## 2021-08-29 NOTE — ED Provider Notes (Signed)
[facility] EMERGENCY DEPT Provider Note   CSN: [id number] Arrival date & time: 08/29/21  4709     History  Chief Complaint  Patient presents with   Headache    Patrick Silva is a 31 y.o. male.   Headache  31 year old male who is currently on postop day 6 from wisdom tooth extraction who presents to the emergency department with a headache and nausea and vomiting.  The patient states that he has been taking hydrocodone and Tylenol for pain control.  He initially did not notice but did notice last night that after taking hydrocodone, he developed nausea, sweatiness and subsequent headache.  His headache was described as a throbbing sensation along the right temple, no radiation, no neck stiffness or rigidity, no fevers, with associated nausea and vomiting.  It was not sudden onset or maximal in onset.  It came on gradually.  He has been taking Tylenol every day for pain control after his wisdom teeth were removed on the eighth.  Home Medications Prior to Admission medications   Medication Sig Start Date End Date Taking? Authorizing Provider  cyclobenzaprine (FLEXERIL) 10 MG tablet Take 1 tablet (10 mg total) by mouth 2 (two) times daily as needed for muscle spasms. 05/31/21   Wynetta Fines, MD  ondansetron (ZOFRAN ODT) 4 MG disintegrating tablet Take 1 tablet (4 mg total) by mouth every 8 (eight) hours as needed for nausea or vomiting. 11/07/20   Milagros Loll, MD  tacrolimus (PROTOPIC) 0.03 % ointment Apply to affected area twice daily for up to 6 weeks, then off 12/10/19   Julieanne Manson, MD  triamcinolone cream (KENALOG) 0.1 % Apply scant amount topically to affected area twice daily as needed. 12/10/19   Julieanne Manson, MD      Allergies    Reglan [metoclopramide] and Hydrocodone    Review of Systems   Review of Systems  Neurological:  Positive for headaches.  All other systems reviewed and are negative.  Physical Exam Updated Vital Signs BP  116/87    Pulse 61    Temp 98.2 F (36.8 C) (Oral)    Resp 18    Ht 6' (1.829 m)    Wt 83.9 kg    SpO2 98%    BMI 25.09 kg/m  Physical Exam Vitals and nursing note reviewed.  Constitutional:      General: He is not in acute distress. HENT:     Head: Normocephalic and atraumatic.     Mouth/Throat:     Comments: No evidence for dry socket, no evidence of infection with no evidence of abscess or purulent drainage Eyes:     Conjunctiva/sclera: Conjunctivae normal.     Pupils: Pupils are equal, round, and reactive to light.  Neck:     Comments: No meningismus Cardiovascular:     Rate and Rhythm: Normal rate and regular rhythm.  Pulmonary:     Effort: Pulmonary effort is normal. No respiratory distress.  Abdominal:     General: There is no distension.     Tenderness: There is no guarding.  Musculoskeletal:        General: No deformity or signs of injury.     Cervical back: Full passive range of motion without pain and neck supple.  Skin:    Findings: No lesion or rash.  Neurological:     General: No focal deficit present.     Mental Status: He is alert. Mental status is at baseline.     GCS: GCS eye subscore  is 4. GCS verbal subscore is 5. GCS motor subscore is 6.     Comments: MENTAL STATUS EXAM:    Orientation: Alert and oriented to person, place and time.  Memory: Cooperative, follows commands well.  Language: Speech is clear and language is normal.   CRANIAL NERVES:    CN 2 (Optic): Visual fields intact to confrontation.  CN 3,4,6 (EOM): Pupils equal and reactive to light. Full extraocular eye movement without nystagmus.  CN 5 (Trigeminal): Facial sensation is normal, no weakness of masticatory muscles.  CN 7 (Facial): No facial weakness or asymmetry.  CN 8 (Auditory): Auditory acuity grossly normal.  CN 9,10 (Glossophar): The uvula is midline, the palate elevates symmetrically.  CN 11 (spinal access): Normal sternocleidomastoid and trapezius strength.  CN 12 (Hypoglossal):  The tongue is midline. No atrophy or fasciculations.Marland Kitchen   MOTOR:  Muscle Strength: 5/5RUE, 5/5LUE, 5/5RLE, 5/5LLE.   COORDINATION:   Intact finger-to-nose, no tremor.   SENSATION:   Intact to light touch all four extremities.  GAIT: Gait normal without ataxia     ED Results / Procedures / Treatments   Labs (all labs ordered are listed, but only abnormal results are displayed) Labs Reviewed - No data to display  EKG None  Radiology No results found.  Procedures Procedures    Medications Ordered in ED Medications  sodium chloride 0.9 % bolus 1,000 mL (0 mLs Intravenous Stopped 08/29/21 1008)  ketorolac (TORADOL) 15 MG/ML injection 15 mg (15 mg Intravenous Given 08/29/21 0822)  metoCLOPramide (REGLAN) injection 10 mg (10 mg Intravenous Given 08/29/21 0822)  diphenhydrAMINE (BENADRYL) injection 25 mg (25 mg Intravenous Given 08/29/21 0865)  acetaminophen (TYLENOL) tablet 1,000 mg (1,000 mg Oral Given 08/29/21 0826)  LORazepam (ATIVAN) injection 1 mg (1 mg Intravenous Given 08/29/21 7846)    ED Course/ Medical Decision Making/ A&P                           Medical Decision Making Risk OTC drugs. Prescription drug management.   31 year old male who is currently on postop day 6 from wisdom tooth extraction who presents to the emergency department with a headache and nausea and vomiting.  The patient states that he has been taking hydrocodone and Tylenol for pain control.  He initially did not notice but did notice last night that after taking hydrocodone, he developed nausea, sweatiness and subsequent headache.  His headache was described as a throbbing sensation along the right temple, no radiation, no neck stiffness or rigidity, no fevers, with associated nausea and vomiting.  It was not sudden onset or maximal in onset.  It came on gradually.  He has been taking Tylenol every day for pain control after his wisdom teeth were removed on the eighth.  On arrival, patient was afebrile,  hemodynamically stable, mildly hypertensive BP 146/90, saturating 100% on room air.  The patient has a normal neurologic exam.  He has no meningismus.  He has no evidence of post wisdom tooth extraction infection.  He does present with persistent pain and an apparent reaction to hydrocodone with a flushing sensation, diaphoresis, nausea after taking the medication.  Symptoms have resolved but his headache persists.  He describes migrainous symptoms with a throbbing right-sided headache with associated nausea and vomiting, no hematemesis, some photophobia without visual field deficit or vision changes.  He is neurologically intact.  Currently he is awake, alert, GCS 15, HDS, and afebrile.  His exam is most notable for normal  gait, fully intact extraocular motions with bilaterally reactive pupils, no focal neurologic deficits, no meningismus, and no temporal tenderness. There is no rash. The headache was not sudden onset or the worst headache of the patient's life. There is no visual deficit.  I am most concerned for migraine headache and reaction to hydrocodone.  At this time, given the patient's reassuring neurologic exam, I do not think further imaging of the head via CT is warranted.  I do not think the patient has an aneurysm, intracranial bleed, mass lesion, meningitis, temporal arteritis, stroke, cluster headache, idiopathic intracranial hypertension, cavernous sinus thrombosis, carbon monoxide toxicity, herpes zoster, carotid or vertebral artery dissection, or acute angle close glaucoma.  The patient was administered a migraine cocktail with IV Toradol, 1 g Tylenol, and IV fluid bolus, IV Reglan 10 mg and IV Benadryl 25 mg.  He did have an episode of akathisia after Reglan administration which resolved following Ativan IV.  No further episodes were noted.  On reassessment, the patient remained well appearing and was again able to ambulate without difficulty and did not have any focal neurologic  deficits.  I believe the patient is stable for discharge.  I believe he should follow-up with his oral surgeon given his persistent postoperative pain without clear evidence of dry socket on exam.  We participated in shared decision making regarding continued observation in the ED versus discharge home for continued recovery after receiving the below medications.  He preferred to recover at home. I believe that this is safe and reasonable.  We have discussed the diagnosis and risks, and we agree with discharging home to follow-up with their primary doctor. We also discussed returning to the Emergency Department immediately if new or worsening symptoms occur. We have discussed the symptoms which are most concerning (e.g., changing or worsening pain, weakness, vomiting, fever, or abnormal sensation) that necessitate immediate return. I provided ED return precautions. The patient felt safe with this plan.  ED Medication Summary: Medications  sodium chloride 0.9 % bolus 1,000 mL (0 mLs Intravenous Stopped 08/29/21 1008)  ketorolac (TORADOL) 15 MG/ML injection 15 mg (15 mg Intravenous Given 08/29/21 5638)  metoCLOPramide (REGLAN) injection 10 mg (10 mg Intravenous Given 08/29/21 9373)  diphenhydrAMINE (BENADRYL) injection 25 mg (25 mg Intravenous Given 08/29/21 4287)  acetaminophen (TYLENOL) tablet 1,000 mg (1,000 mg Oral Given 08/29/21 0826)  LORazepam (ATIVAN) injection 1 mg (1 mg Intravenous Given 08/29/21 0839)     Final Clinical Impression(s) / ED Diagnoses Final diagnoses:  Migraine without status migrainosus, not intractable, unspecified migraine type  Adverse effect of drug, initial encounter    Rx / DC Orders ED Discharge Orders     None         Ernie Avena, MD 08/29/21 2241

## 2021-08-29 NOTE — ED Triage Notes (Signed)
Pt arrives via EMS from home with reports of HA and nausea since last night after taking hydrocodone around 1130. Pt had wisdom teeth removed on the 8th.

## 2021-08-29 NOTE — Discharge Instructions (Addendum)
Please follow-up with your oral surgeon to discuss post op care given your persistent pain. Alternate 1g Tylenol and Motrin 600mg  for pain.

## 2021-10-15 ENCOUNTER — Other Ambulatory Visit: Payer: Self-pay

## 2021-10-15 ENCOUNTER — Encounter (HOSPITAL_BASED_OUTPATIENT_CLINIC_OR_DEPARTMENT_OTHER): Payer: Self-pay

## 2021-10-15 ENCOUNTER — Emergency Department (HOSPITAL_BASED_OUTPATIENT_CLINIC_OR_DEPARTMENT_OTHER): Payer: 59 | Admitting: Radiology

## 2021-10-15 ENCOUNTER — Emergency Department (HOSPITAL_BASED_OUTPATIENT_CLINIC_OR_DEPARTMENT_OTHER): Payer: 59

## 2021-10-15 ENCOUNTER — Emergency Department (HOSPITAL_BASED_OUTPATIENT_CLINIC_OR_DEPARTMENT_OTHER)
Admission: EM | Admit: 2021-10-15 | Discharge: 2021-10-15 | Disposition: A | Discharge status: Home / Self Care | Payer: 59 | Attending: Emergency Medicine | Admitting: Emergency Medicine

## 2021-10-15 DIAGNOSIS — R519 Headache, unspecified: Secondary | ICD-10-CM | POA: Diagnosis not present

## 2021-10-15 DIAGNOSIS — S3992XA Unspecified injury of lower back, initial encounter: Secondary | ICD-10-CM | POA: Insufficient documentation

## 2021-10-15 DIAGNOSIS — S8992XA Unspecified injury of left lower leg, initial encounter: Secondary | ICD-10-CM | POA: Diagnosis present

## 2021-10-15 DIAGNOSIS — Y92410 Unspecified street and highway as the place of occurrence of the external cause: Secondary | ICD-10-CM | POA: Diagnosis not present

## 2021-10-15 DIAGNOSIS — S199XXA Unspecified injury of neck, initial encounter: Secondary | ICD-10-CM | POA: Diagnosis not present

## 2021-10-15 MED ORDER — CYCLOBENZAPRINE HCL 5 MG PO TABS
5.0000 mg | ORAL_TABLET | Freq: Once | ORAL | Status: AC
  Administered 2021-10-15: 5 mg via ORAL
  Filled 2021-10-15: qty 1

## 2021-10-15 MED ORDER — IBUPROFEN 800 MG PO TABS
800.0000 mg | ORAL_TABLET | Freq: Once | ORAL | Status: AC
  Administered 2021-10-15: 800 mg via ORAL
  Filled 2021-10-15: qty 1

## 2021-10-15 MED ORDER — CYCLOBENZAPRINE HCL 10 MG PO TABS: 10.0000 mg | ORAL_TABLET | Freq: Two times a day (BID) | ORAL | 0 refills | Status: DC | PRN

## 2021-10-15 NOTE — ED Notes (Signed)
Patient transported to CT 
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
## 2021-10-15 NOTE — ED Notes (Signed)
Pt verbalizes understanding of discharge instructions. Opportunity for questioning and answers were provided. Pt discharged from ED to home with mother.   ? ?

## 2021-10-15 NOTE — ED Notes (Signed)
Patient transported to X-ray 

## 2023-01-30 ENCOUNTER — Ambulatory Visit: Payer: Medicaid Other | Admitting: Internal Medicine

## 2023-01-30 ENCOUNTER — Encounter: Payer: Self-pay | Admitting: Internal Medicine

## 2023-01-30 VITALS — BP 126/78 | HR 70 | Resp 16 | Ht 71.5 in | Wt 196.5 lb

## 2023-01-30 DIAGNOSIS — L301 Dyshidrosis [pompholyx]: Secondary | ICD-10-CM | POA: Diagnosis not present

## 2023-01-30 DIAGNOSIS — L409 Psoriasis, unspecified: Secondary | ICD-10-CM

## 2023-01-30 DIAGNOSIS — J3089 Other allergic rhinitis: Secondary | ICD-10-CM

## 2023-01-30 MED ORDER — CETIRIZINE HCL 10 MG PO TABS: 10.0000 mg | ORAL_TABLET | Freq: Every day | ORAL | 11 refills | Status: AC

## 2023-01-30 MED ORDER — TACROLIMUS 0.03 % EX OINT: TOPICAL_OINTMENT | CUTANEOUS | 1 refills | Status: AC

## 2023-01-30 MED ORDER — TRIAMCINOLONE ACETONIDE 0.1 % EX CREA: TOPICAL_CREAM | CUTANEOUS | 2 refills | Status: AC
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
## 2023-02-18 ENCOUNTER — Encounter (HOSPITAL_BASED_OUTPATIENT_CLINIC_OR_DEPARTMENT_OTHER): Payer: Self-pay

## 2023-02-18 ENCOUNTER — Emergency Department (HOSPITAL_BASED_OUTPATIENT_CLINIC_OR_DEPARTMENT_OTHER)
Admission: EM | Admit: 2023-02-18 | Discharge: 2023-02-18 | Disposition: A | Discharge status: Home / Self Care | Payer: Medicaid Other | Attending: Emergency Medicine | Admitting: Emergency Medicine

## 2023-02-18 ENCOUNTER — Emergency Department (HOSPITAL_BASED_OUTPATIENT_CLINIC_OR_DEPARTMENT_OTHER): Payer: Medicaid Other

## 2023-02-18 ENCOUNTER — Other Ambulatory Visit: Payer: Self-pay

## 2023-02-18 DIAGNOSIS — N50811 Right testicular pain: Secondary | ICD-10-CM | POA: Diagnosis not present

## 2023-02-18 DIAGNOSIS — N50812 Left testicular pain: Secondary | ICD-10-CM | POA: Diagnosis not present

## 2023-02-18 DIAGNOSIS — N50819 Testicular pain, unspecified: Secondary | ICD-10-CM

## 2023-02-18 LAB — CBC WITH DIFFERENTIAL/PLATELET
Abs Immature Granulocytes: 0.03 10*3/uL (ref 0.00–0.07)
Basophils Absolute: 0 10*3/uL (ref 0.0–0.1)
Basophils Relative: 0 %
Eosinophils Absolute: 0.1 10*3/uL (ref 0.0–0.5)
Eosinophils Relative: 1 %
HCT: 46.5 % (ref 39.0–52.0)
Hemoglobin: 15.2 g/dL (ref 13.0–17.0)
Immature Granulocytes: 0 %
Lymphocytes Relative: 29 %
Lymphs Abs: 2.9 10*3/uL (ref 0.7–4.0)
MCH: 27 pg (ref 26.0–34.0)
MCHC: 32.7 g/dL (ref 30.0–36.0)
MCV: 82.4 fL (ref 80.0–100.0)
Monocytes Absolute: 0.8 10*3/uL (ref 0.1–1.0)
Monocytes Relative: 8 %
Neutro Abs: 6.2 10*3/uL (ref 1.7–7.7)
Neutrophils Relative %: 62 %
Platelets: 348 10*3/uL (ref 150–400)
RBC: 5.64 MIL/uL (ref 4.22–5.81)
RDW: 13.5 % (ref 11.5–15.5)
WBC: 10 10*3/uL (ref 4.0–10.5)
nRBC: 0 % (ref 0.0–0.2)

## 2023-02-18 LAB — URINALYSIS, W/ REFLEX TO CULTURE (INFECTION SUSPECTED)
Bacteria, UA: NONE SEEN
Bilirubin Urine: NEGATIVE
Glucose, UA: NEGATIVE mg/dL
Hgb urine dipstick: NEGATIVE
Ketones, ur: NEGATIVE mg/dL
Leukocytes,Ua: NEGATIVE
Nitrite: NEGATIVE
Specific Gravity, Urine: 1.032 — ABNORMAL HIGH (ref 1.005–1.030)
pH: 8 (ref 5.0–8.0)

## 2023-02-18 LAB — COMPREHENSIVE METABOLIC PANEL
ALT: 17 U/L (ref 0–44)
AST: 17 U/L (ref 15–41)
Albumin: 5.1 g/dL — ABNORMAL HIGH (ref 3.5–5.0)
Alkaline Phosphatase: 59 U/L (ref 38–126)
Anion gap: 7 (ref 5–15)
BUN: 21 mg/dL — ABNORMAL HIGH (ref 6–20)
CO2: 31 mmol/L (ref 22–32)
Calcium: 10.1 mg/dL (ref 8.9–10.3)
Chloride: 100 mmol/L (ref 98–111)
Creatinine, Ser: 1.23 mg/dL (ref 0.61–1.24)
GFR, Estimated: >60 mL/min (ref >60)
Glucose, Bld: 65 mg/dL — ABNORMAL LOW (ref 70–99)
Potassium: 4 mmol/L (ref 3.5–5.1)
Sodium: 138 mmol/L (ref 135–145)
Total Bilirubin: 0.5 mg/dL (ref 0.3–1.2)
Total Protein: 8.2 g/dL — ABNORMAL HIGH (ref 6.5–8.1)

## 2023-02-18 LAB — LIPASE, BLOOD: Lipase: 17 U/L (ref 11–51)

## 2023-02-18 NOTE — Discharge Instructions (Addendum)
Overall it does look you might have a small cyst in your right epididymis, follow-up with urology.  Recommend Tylenol and ibuprofen for pain.  Wear comfortable underwear.
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
## 2023-02-18 NOTE — ED Provider Triage Note (Signed)
Emergency Medicine Provider Triage Evaluation Note  Patrick Silva , a 32 y.o. male  was evaluated in triage.  Pt complains of testicle pain.  Review of Systems  Positive:  Negative:   Physical Exam  BP (!) 146/93 (BP Location: Right Arm)   Pulse 77   Temp 98.8 F (37.1 C) (Oral)   Resp 18   Ht 5' 11.5" (1.816 m)   Wt 89.4 kg   SpO2 98%   BMI 27.09 kg/m  Gen:   Awake, no distress   Resp:  Normal effort  MSK:   Moves extremities without difficulty  Other:    Medical Decision Making  Medically screening exam initiated at 3:21 PM.  Appropriate orders placed.  Jenetta Downer was informed that the remainder of the evaluation will be completed by another provider, this initial triage assessment does not replace that evaluation, and the importance of remaining in the ED until their evaluation is complete.  Testicular pain that has been slowly progressing x1 month. No trauma. Sexually active with 2 partners in the past 12 months. Intermittent condom use.   Denies fever, dysuria, hematuria, vomiting, diarrhea. Denies penile discharge or genital lesions.    Valrie Hart F, [place] 02/18/23 1525

## 2023-02-18 NOTE — ED Provider Notes (Signed)
[place] EMERGENCY DEPARTMENT AT [facility] Provider Note   CSN: [id number] Arrival date & time: 02/18/23  1408     History  Chief Complaint  Patient presents with   Testicle Pain    Jamara Bombara is a 32 y.o. male.  The history is provided by the patient.       Home Medications Prior to Admission medications   Medication Sig Start Date End Date Taking? Authorizing Provider  cetirizine (ZYRTEC ALLERGY) 10 MG tablet Take 1 tablet (10 mg total) by mouth daily. 01/30/23   Julieanne Manson, MD  tacrolimus (PROTOPIC) 0.03 % ointment Apply to affected area twice daily for up to 6 weeks, then off 01/30/23   Julieanne Manson, MD  triamcinolone cream (KENALOG) 0.1 % Apply scant amount topically to affected area twice daily as needed. 01/30/23   Julieanne Manson, MD      Allergies    Reglan [metoclopramide] and Hydrocodone    Review of Systems   Review of Systems  Physical Exam Updated Vital Signs BP 132/84   Pulse 74   Temp 98.8 F (37.1 C) (Oral)   Resp 18   Ht 5' 11.5" (1.816 m)   Wt 89.4 kg   SpO2 100%   BMI 27.09 kg/m  Physical Exam Vitals and nursing note reviewed.  Constitutional:      General: He is not in acute distress.    Appearance: He is well-developed. He is not ill-appearing.  HENT:     Head: Normocephalic and atraumatic.     Nose: Nose normal.  Eyes:     Extraocular Movements: Extraocular movements intact.     Conjunctiva/sclera: Conjunctivae normal.     Pupils: Pupils are equal, round, and reactive to light.  Cardiovascular:     Rate and Rhythm: Normal rate and regular rhythm.     Pulses: Normal pulses.     Heart sounds: Normal heart sounds. No murmur heard. Pulmonary:     Effort: Pulmonary effort is normal. No respiratory distress.     Breath sounds: Normal breath sounds.  Abdominal:     General: Abdomen is flat.     Palpations: Abdomen is soft.     Tenderness: There is no abdominal tenderness.  Genitourinary:    Comments:  Tenderness to testicles bilaterally but no abnormal lie or major swelling Musculoskeletal:        General: No swelling.     Cervical back: Normal range of motion and neck supple.  Skin:    General: Skin is warm and dry.     Capillary Refill: Capillary refill takes less than 2 seconds.  Neurological:     General: No focal deficit present.     Mental Status: He is alert.  Psychiatric:        Mood and Affect: Mood normal.     ED Results / Procedures / Treatments   Labs (all labs ordered are listed, but only abnormal results are displayed) Labs Reviewed  COMPREHENSIVE METABOLIC PANEL - Abnormal; Notable for the following components:      Result Value   Glucose, Bld 65 (*)    BUN 21 (*)    Total Protein 8.2 (*)    Albumin 5.1 (*)    All other components within normal limits  URINALYSIS, W/ REFLEX TO CULTURE (INFECTION SUSPECTED) - Abnormal; Notable for the following components:   Specific Gravity, Urine 1.032 (*)    Protein, ur TRACE (*)    All other components within normal limits  CBC WITH DIFFERENTIAL/PLATELET  LIPASE, BLOOD  RPR  GC/CHLAMYDIA PROBE AMP ([facility]) NOT AT [facility]    EKG None  Radiology US SCROTUM W/DOPPLER  Result Date: 02/18/2023 CLINICAL DATA:  Pain EXAM: SCROTAL ULTRASOUND DOPPLER ULTRASOUND OF THE TESTICLES TECHNIQUE: Complete ultrasound examination of the testicles, epididymis, and other scrotal structures was performed. Color and spectral Doppler ultrasound were also utilized to evaluate blood flow to the testicles. COMPARISON:  None Available. FINDINGS: Right testicle Measurements: 4.4 x 2.2 x 2.9 cm. No mass or microlithiasis visualized. Left testicle Measurements: 4.8 x 2.1 x 3.1 cm. No mass or microlithiasis visualized. Right epididymis: There is 1.3 cm cyst with thin internal septation in the right epididymis. Left epididymis:  Normal in size and appearance. Hydrocele:  None visualized. Varicocele:  None visualized. Pulsed Doppler interrogation of  both testes demonstrates normal low resistance arterial and venous waveforms bilaterally. IMPRESSION: There is no evidence of testicular torsion. There is homogeneous echogenicity in both testes. There is 1.3 cm cyst with internal septations in the right epididymis. Electronically Signed   By: Ernie Avena M.D.   On: 02/18/2023 16:52    Procedures Procedures    Medications Ordered in ED Medications - No data to display  ED Course/ Medical Decision Making/ A&P                                 Medical Decision Making Amount and/or Complexity of Data Reviewed Radiology: ordered.   Jenetta Downer.  Bilateral testicular pain for the last few weeks.  Denies any discharge.  Testicular exam is overall unremarkable except for some tenderness.  Ultrasound showed right sided epididymal cysts but no orchitis or epididymitis otherwise.  Lab work was unremarkable per my review and interpretation.  Overall we will hold on any treatment for STDs.  These have been sent off.  He will follow-up with urology.  Recommend Tylenol and ibuprofen.  Discharged in good condition.  Have no concern for infectious process at this time.  This chart was dictated using voice recognition software.  Despite best efforts to proofread,  errors can occur which can change the documentation meaning.         Final Clinical Impression(s) / ED Diagnoses Final diagnoses:  Pain in testicle, unspecified laterality    Rx / DC Orders ED Discharge Orders     None         Virgina Norfolk, DO 02/18/23 1802

## 2023-05-17 ENCOUNTER — Other Ambulatory Visit: Payer: Medicaid Other

## 2023-05-17 DIAGNOSIS — Z0000 Encounter for general adult medical examination without abnormal findings: Secondary | ICD-10-CM

## 2023-05-18 LAB — CBC WITH DIFFERENTIAL/PLATELET
Basophils Absolute: 0 10*3/uL (ref 0.0–0.2)
Basos: 1 %
EOS (ABSOLUTE): 0.1 10*3/uL (ref 0.0–0.4)
Eos: 2 %
Hematocrit: 45.9 % (ref 37.5–51.0)
Hemoglobin: 14.2 g/dL (ref 13.0–17.7)
Immature Grans (Abs): 0 10*3/uL (ref 0.0–0.1)
Immature Granulocytes: 0 %
Lymphocytes Absolute: 2.4 10*3/uL (ref 0.7–3.1)
Lymphs: 37 %
MCH: 26.7 pg (ref 26.6–33.0)
MCHC: 30.9 g/dL — ABNORMAL LOW (ref 31.5–35.7)
MCV: 86 fL (ref 79–97)
Monocytes Absolute: 0.7 10*3/uL (ref 0.1–0.9)
Monocytes: 11 %
Neutrophils Absolute: 3.2 10*3/uL (ref 1.4–7.0)
Neutrophils: 49 %
Platelets: 302 10*3/uL (ref 150–450)
RBC: 5.31 x10E6/uL (ref 4.14–5.80)
RDW: 13 % (ref 11.6–15.4)
WBC: 6.5 10*3/uL (ref 3.4–10.8)

## 2023-05-18 LAB — COMPREHENSIVE METABOLIC PANEL
ALT: 31 [IU]/L (ref 0–44)
AST: 20 [IU]/L (ref 0–40)
Albumin: 4.6 g/dL (ref 4.1–5.1)
Alkaline Phosphatase: 74 [IU]/L (ref 44–121)
BUN/Creatinine Ratio: 17 (ref 9–20)
BUN: 18 mg/dL (ref 6–20)
Bilirubin Total: 0.2 mg/dL (ref 0.0–1.2)
CO2: 27 mmol/L (ref 20–29)
Calcium: 9.4 mg/dL (ref 8.7–10.2)
Chloride: 102 mmol/L (ref 96–106)
Creatinine, Ser: 1.09 mg/dL (ref 0.76–1.27)
Globulin, Total: 2.6 g/dL (ref 1.5–4.5)
Glucose: 100 mg/dL — ABNORMAL HIGH (ref 70–99)
Potassium: 4.8 mmol/L (ref 3.5–5.2)
Sodium: 140 mmol/L (ref 134–144)
Total Protein: 7.2 g/dL (ref 6.0–8.5)
eGFR: 91 mL/min/{1.73_m2} (ref >59)

## 2023-05-18 LAB — LIPID PANEL W/O CHOL/HDL RATIO
Cholesterol, Total: 171 mg/dL (ref 100–199)
HDL: 49 mg/dL (ref >39)
LDL Chol Calc (NIH): 112 mg/dL — ABNORMAL HIGH (ref 0–99)
Triglycerides: 49 mg/dL (ref 0–149)
VLDL Cholesterol Cal: 10 mg/dL (ref 5–40)

## 2023-05-21 ENCOUNTER — Encounter: Payer: Self-pay | Admitting: Internal Medicine

## 2023-05-21 ENCOUNTER — Ambulatory Visit: Payer: Medicaid Other | Admitting: Internal Medicine
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
## 2024-05-21 ENCOUNTER — Other Ambulatory Visit: Payer: Medicaid Other

## 2024-05-25 ENCOUNTER — Encounter: Payer: Medicaid Other | Admitting: Internal Medicine
